# Patient Record
Sex: Female | Born: 1955 | Race: White | Hispanic: No | Marital: Single | Smoking: Never smoker
Health system: Southern US, Community
[De-identification: ages and names within clinical notes are randomized; demographics above are authoritative.]

## PROBLEM LIST (undated history)

## (undated) DIAGNOSIS — I10 Essential (primary) hypertension: Secondary | ICD-10-CM

## (undated) DIAGNOSIS — F32A Depression, unspecified: Secondary | ICD-10-CM

## (undated) DIAGNOSIS — E0590 Thyrotoxicosis, unspecified without thyrotoxic crisis or storm: Secondary | ICD-10-CM

## (undated) DIAGNOSIS — F329 Major depressive disorder, single episode, unspecified: Secondary | ICD-10-CM

## (undated) HISTORY — DX: Major depressive disorder, single episode, unspecified: F32.9

## (undated) HISTORY — DX: Depression, unspecified: F32.A

## (undated) HISTORY — DX: Essential (primary) hypertension: I10

## (undated) HISTORY — DX: Thyrotoxicosis, unspecified without thyrotoxic crisis or storm: E05.90

---

## 2013-12-20 ENCOUNTER — Ambulatory Visit: Payer: Self-pay | Admitting: Neurology

## 2013-12-26 ENCOUNTER — Ambulatory Visit (INDEPENDENT_AMBULATORY_CARE_PROVIDER_SITE_OTHER): Payer: Medicare Other | Admitting: Neurology

## 2013-12-26 ENCOUNTER — Encounter: Payer: Self-pay | Admitting: Neurology

## 2013-12-26 VITALS — BP 132/80 | HR 82 | Resp 16 | Ht 60.0 in | Wt 99.3 lb

## 2013-12-26 DIAGNOSIS — E0500 Thyrotoxicosis with diffuse goiter without thyrotoxic crisis or storm: Secondary | ICD-10-CM

## 2013-12-26 DIAGNOSIS — I10 Essential (primary) hypertension: Secondary | ICD-10-CM

## 2013-12-26 DIAGNOSIS — R413 Other amnesia: Secondary | ICD-10-CM

## 2013-12-26 DIAGNOSIS — F79 Unspecified intellectual disabilities: Secondary | ICD-10-CM

## 2013-12-26 LAB — VITAMIN B12: VITAMIN B 12: 512 pg/mL (ref 211–911)

## 2013-12-26 NOTE — Patient Instructions (Signed)
1. Bloodwork for vitamin B12 2. Schedule head CT without contrast 3. Physical exercises and brain stimulation exercises are important for brain health 4. Follow-up in 6 months

## 2013-12-26 NOTE — Progress Notes (Signed)
NEUROLOGY CONSULTATION NOTE  Laura Keith MRN: [id number] DOB: 21-Oct-1955  Referring provider: Dr. Mila Palmer Primary care provider: Dr. Mila Palmer  Reason for consult:  Evaluate for dementia  Dear Dr Paulino Rily:  Thank you for your kind referral of Luz Mares for consultation of the above symptoms. Although her history is well known to you, please allow me to reiterate it for the purpose of our medical record. The patient was accompanied to the clinic by her twin sister Laura Keith and her adoptive sister Laura Keith, who also provide collateral information. Records and images were personally reviewed where available.  HISTORY OF PRESENT ILLNESS: This is a pleasant 58 year old ambidextrous right-hand dominant woman with a history of hypertension, hyperthyroidism, mild intellectual disability, presenting for evaluation of memory changes.  Her sister Laura Keith provides majority of the history, reporting that Laura Keith has always struggled between what is fact and fantasy.  They had been living with their adoptive mother until this year when they moved into a group home last May.  The staff at the group home and Laura Keith have noticed that her functioning level has been declining.  It has been suggested that she may have dementia, and this has been difficult to distinguish with her intellectual disability. Laura Keith would like to make sure she is doing everything to keep her sisters as healthy as possible.  Laura Keith states that Laura Keith's twin Laura Keith has a lower IQ but she leads the two because [name]'s ability to function and communicate have gone down rapidly.  She has been having problems communicating, with difficulty understanding her speech.  Laura Keith is actually amazed today at how well [name] was speaking in the office, stating she is animated and talkative today. She is usually more withdrawn and shut down. Laura Keith states Laura Keith is "hilarious, but I have not seen her be hilarious in a year."  She has been having diarrhea,  with extensive evaluation leading to a diagnosis of possibly due to anxiety. With this, she would tell group home staff that she did not have diarrhea, but they found the bathroom a mess because she tried to clean it. They are unsure if [name] does not remember she had a BM or if she did not want staff to know.  She had a buttock sore because of this, and has needed help with bathing and hygiene since June.  She sneaks food out, which has been chronic.  She was started on Zoloft around 2 months ago, with note of improvement in her speech. At that time, she was also smearing lipstick all over her face, this has improved as well.  Her sister reports her speech is clearer today than ever, she is surprised Laura Keith is speaking in sentences today.    Labrea denies any headaches, dizziness, diplopia, dysphagia, neck/back pain, focal numbness/tingling/weakness, bowel/bladder dysfunction. She tells me diarrhea has resolved, however Laura Keith states she has heard otherwise.  [name] is active in the group home, they volunteer for the [name] twice a week, go to church choir. They have noticed that she is getting slower when getting in and out of the car. No head injuries or falls.  The twins were adopted, and thought to have fetal alcohol syndrome.  Laboratory Data: 07/24/2013 CBC, CMP normal. TSH 0.15, free T4 1.29, Lipid panel showed cholesterol 221, LDL 144, HbA1c 5.9.  PAST MEDICAL HISTORY: Past Medical History  Diagnosis Date  . Hypertension   . Hyperthyroidism   . Depression     PAST SURGICAL HISTORY: No past surgical  history on file.  MEDICATIONS: No current outpatient prescriptions on file prior to visit.   No current facility-administered medications on file prior to visit.    ALLERGIES: Allergies  Allergen Reactions  . Codeine Nausea Only and Anxiety    FAMILY HISTORY: No family history on file.  SOCIAL HISTORY: History   Social History  . Marital Status: Single    Spouse Name: N/A     Number of Children: N/A  . Years of Education: N/A   Occupational History  . Not on file.   Social History Main Topics  . Smoking status: Never Smoker   . Smokeless tobacco: Not on file  . Alcohol Use: No  . Drug Use: No  . Sexual Activity: Not on file   Other Topics Concern  . Not on file   Social History Narrative  . No narrative on file    REVIEW OF SYSTEMS: Constitutional: No fevers, chills, or sweats, no generalized fatigue, change in appetite Eyes: No visual changes, double vision, eye pain Ear, nose and throat: No hearing loss, ear pain, nasal congestion, sore throat Cardiovascular: No chest pain, palpitations Respiratory:  No shortness of breath at rest or with exertion, wheezes GastrointestinaI: No nausea, vomiting, +diarrhea, no abdominal pain, fecal incontinence Genitourinary:  No dysuria, urinary retention or frequency Musculoskeletal:  No neck pain, back pain Integumentary: No rash, pruritus, skin lesions Neurological: as above Psychiatric: No depression, insomnia, anxiety Endocrine: No palpitations, fatigue, diaphoresis, mood swings, change in appetite, change in weight, increased thirst Hematologic/Lymphatic:  No anemia, purpura, petechiae. Allergic/Immunologic: no itchy/runny eyes, nasal congestion, recent allergic reactions, rashes  PHYSICAL EXAM: Filed Vitals:   12/26/13 1106  BP: 132/80  Pulse: 82  Resp: 16   General: No acute distress Head:  Normocephalic/atraumatic Eyes: Fundoscopic exam shows bilateral sharp discs, no vessel changes, exudates, or hemorrhages Neck: supple, no paraspinal tenderness, full range of motion Back: No paraspinal tenderness Heart: regular rate and rhythm Lungs: Clear to auscultation bilaterally. Vascular: No carotid bruits. Skin/Extremities: No rash, no edema Neurological Exam: Mental status: alert and oriented to person, place, and time, no dysarthria or aphasia, Fund of knowledge is appropriate.  Remote memory  intact.  Attention and concentration are normal.    Able to name objects and repeat phrases.  MMSE - Mini Mental State Exam 12/28/2013  Orientation to time 5  Orientation to Place 4  Registration 3  Attention/ Calculation 5  Recall 0  Language- name 2 objects 2  Language- repeat 1  Language- follow 3 step command 3  Language- read & follow direction 1  Write a sentence 1  Copy design 1  Total score 26   Cranial nerves: CN I: not tested CN II: pupils equal, round and reactive to light, visual fields intact, fundi unremarkable. CN III, IV, VI:  full range of motion, no nystagmus, no ptosis CN V: facial sensation intact CN VII: upper and lower face symmetric CN VIII: hearing intact to finger rub CN IX, X: gag intact, uvula midline CN XI: sternocleidomastoid and trapezius muscles intact CN XII: tongue midline Bulk & Tone: normal, no fasciculations. Motor: 5/5 throughout with no pronator drift. Sensation: intact to light touch, cold, pin, vibration and joint position sense.  No extinction to double simultaneous stimulation.  Romberg test negative Deep Tendon Reflexes: +2 throughout, no ankle clonus Plantar responses: downgoing bilaterally Cerebellar: no incoordination on finger to nose, heel to shin. No dysdiadochokinesia Gait: narrow-based and steady, able to tandem walk adequately. 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  She had a neuropsychological evaluation last 05/2013 which showed a full scale IQ of 69 (Deficient range of intellectual functioning). She was noted to have significant impairments in intellectual, adaptive, and social/emotional functioning, indicative of mild mental retardation.  Her sister is concerned about even  worsening functioning since that time. It was repeatedly noted by her sister Laura Keith that she has not seen Katlin this talkative and animated in a long time. We discussed different etiologies of memory loss, check B12 level. A head CT without contrast will be ordered to assess for any underlying structural abnormality. We discussed that with her baseline cognitive impairment, although it may cause difficulties determining which is baseline impairment versus a progressive neurodegenerative condition at this time, mood (anxiety/depression) may be playing a role with her current changes in functioning, particularly since they have noticed an improvement with SSRI.  Her MMSE today is 26/30. We will continue to monitor her over time, which will help further determine if this is more of a progressive neurodegenerative condition such as dementia, but at this time I discussed with her sister that this cannot be definitively diagnosed. We discussed the importance of control of vascular risk factors, physical, and brain stimulation exercises for brain health.She expressed understanding and will follow-up in 6 months.   Thank you for allowing me to participate in the care of this patient. Please do not hesitate to call for any questions or concerns.   Patrcia Dolly, M.D.  CC: Dr. Paulino Rily

## 2013-12-28 ENCOUNTER — Encounter: Payer: Self-pay | Admitting: Neurology

## 2013-12-28 DIAGNOSIS — I10 Essential (primary) hypertension: Secondary | ICD-10-CM | POA: Insufficient documentation

## 2013-12-28 DIAGNOSIS — R413 Other amnesia: Secondary | ICD-10-CM | POA: Insufficient documentation

## 2013-12-28 DIAGNOSIS — E0500 Thyrotoxicosis with diffuse goiter without thyrotoxic crisis or storm: Secondary | ICD-10-CM | POA: Insufficient documentation

## 2013-12-28 DIAGNOSIS — F79 Unspecified intellectual disabilities: Secondary | ICD-10-CM | POA: Insufficient documentation

## 2013-12-30 ENCOUNTER — Telehealth: Payer: Self-pay | Admitting: Neurology

## 2013-12-30 ENCOUNTER — Ambulatory Visit (HOSPITAL_COMMUNITY): Payer: Medicare Other

## 2013-12-30 NOTE — Telephone Encounter (Signed)
Janelle FloorNaomi, pt's caregiver at the group home called requesting to speak to a nurse regarding pt's appt. C/B [phone]

## 2013-12-30 NOTE — Telephone Encounter (Signed)
Returned call. Pt's CT appt needed to be rescheduled due to some transportation issues with the group home pt is staying at. Laura LauberGave Naomi the # for scheduling so she could call & reschedule pt's appt.

## 2014-01-07 ENCOUNTER — Ambulatory Visit (HOSPITAL_COMMUNITY)
Admission: RE | Admit: 2014-01-07 | Discharge: 2014-01-07 | Disposition: A | Discharge status: Home / Self Care | Payer: Medicare Other | Source: Ambulatory Visit | Attending: Neurology | Admitting: Neurology

## 2014-01-07 DIAGNOSIS — J320 Chronic maxillary sinusitis: Secondary | ICD-10-CM | POA: Insufficient documentation

## 2014-01-07 DIAGNOSIS — J322 Chronic ethmoidal sinusitis: Secondary | ICD-10-CM | POA: Insufficient documentation

## 2014-01-07 DIAGNOSIS — R413 Other amnesia: Secondary | ICD-10-CM | POA: Insufficient documentation

## 2014-01-16 ENCOUNTER — Encounter: Payer: Self-pay | Admitting: Neurology

## 2014-05-14 ENCOUNTER — Ambulatory Visit (INDEPENDENT_AMBULATORY_CARE_PROVIDER_SITE_OTHER): Payer: Medicare Other | Admitting: Podiatry

## 2014-05-14 ENCOUNTER — Encounter: Payer: Self-pay | Admitting: Podiatry

## 2014-05-14 VITALS — BP 124/73 | HR 76 | Resp 11 | Ht 63.0 in | Wt 105.0 lb

## 2014-05-14 DIAGNOSIS — B351 Tinea unguium: Secondary | ICD-10-CM | POA: Diagnosis not present

## 2014-05-14 NOTE — Patient Instructions (Signed)
Return as needed for debridement of toenails Please bring complete medical information in at the next visit

## 2014-05-14 NOTE — Progress Notes (Signed)
Subjective:     Patient ID: Laura BushyNancy Sudbeck, female   DOB: 05-05-1955, 59 y.o.   MRN: [id number]  HPI Comments: N foot exam L 10 toenail  D and O long-term C thickened right 1st toenail A difficult to cut T none   patient denies any history of infection or discomfort in the right hallux toenail. There is no history of any active treatment   Review of Systems  All other systems reviewed and are negative.      Objective:   Physical Exam Patient appears responsive and orientated 3 and presents with representative from a group home  Vascular: DP pulses 2/4 bilaterally PT pulses 2/4 bilaterally Capillary reflex immediate bilaterally  Neurological: Sensation to 10 g monofilament wire intact 5/5 bilaterally Ankle reflex equal reactive bilaterally  Dermatological: The hallux toenails have some texture and color changes with mild hypertrophy. 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

## 2014-05-15 ENCOUNTER — Encounter: Payer: Self-pay | Admitting: Podiatry

## 2014-07-02 ENCOUNTER — Ambulatory Visit: Payer: Medicare Other | Admitting: Neurology

## 2014-07-04 ENCOUNTER — Encounter: Payer: Self-pay | Admitting: Neurology

## 2014-07-21 ENCOUNTER — Other Ambulatory Visit: Payer: Self-pay

## 2014-07-21 DIAGNOSIS — Z1231 Encounter for screening mammogram for malignant neoplasm of breast: Secondary | ICD-10-CM

## 2014-07-30 ENCOUNTER — Ambulatory Visit: Payer: Medicare Other | Admitting: Neurology

## 2014-08-04 ENCOUNTER — Ambulatory Visit
Admission: RE | Admit: 2014-08-04 | Discharge: 2014-08-04 | Disposition: A | Discharge status: Home / Self Care | Payer: Medicare Other | Source: Ambulatory Visit

## 2014-08-04 DIAGNOSIS — Z1231 Encounter for screening mammogram for malignant neoplasm of breast: Secondary | ICD-10-CM

## 2014-08-07 ENCOUNTER — Other Ambulatory Visit: Payer: Self-pay | Admitting: Family Medicine

## 2014-08-07 DIAGNOSIS — R928 Other abnormal and inconclusive findings on diagnostic imaging of breast: Secondary | ICD-10-CM

## 2014-08-18 ENCOUNTER — Ambulatory Visit
Admission: RE | Admit: 2014-08-18 | Discharge: 2014-08-18 | Disposition: A | Discharge status: Home / Self Care | Payer: Medicare Other | Source: Ambulatory Visit | Attending: Family Medicine | Admitting: Family Medicine

## 2014-08-18 ENCOUNTER — Ambulatory Visit: Payer: Medicare Other

## 2014-08-18 ENCOUNTER — Other Ambulatory Visit: Payer: Self-pay | Admitting: Family Medicine

## 2014-08-18 DIAGNOSIS — R928 Other abnormal and inconclusive findings on diagnostic imaging of breast: Secondary | ICD-10-CM

## 2014-09-08 ENCOUNTER — Ambulatory Visit: Payer: Medicare Other | Admitting: Neurology

## 2014-10-10 ENCOUNTER — Ambulatory Visit: Payer: Medicare Other | Admitting: Neurology

## 2014-10-29 ENCOUNTER — Encounter: Payer: Self-pay | Admitting: Neurology

## 2014-10-29 ENCOUNTER — Ambulatory Visit (INDEPENDENT_AMBULATORY_CARE_PROVIDER_SITE_OTHER): Payer: Medicare Other | Admitting: Neurology

## 2014-10-29 VITALS — BP 136/80 | HR 68 | Resp 14 | Ht 60.0 in | Wt 119.0 lb

## 2014-10-29 DIAGNOSIS — F79 Unspecified intellectual disabilities: Secondary | ICD-10-CM

## 2014-10-29 NOTE — Progress Notes (Signed)
NEUROLOGY FOLLOW UP OFFICE NOTE  Laura Keith [id number]  HISTORY OF PRESENT ILLNESS: I had the pleasure of seeing Laura Keith in follow-up in the neurology clinic on 10/29/2014.  The patient was last seen 10 months ago for memory changes. She is again accompanied by her sister who helps supplement the history today. MMSE on her last visit was 26/30. I personally reviewed head CT, which was normal. Her sister reports that she is doing much better cognitively since her last visit. 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 She is very happy to report that she loves her job, and that she is doing "perfect." She denies any headaches, dizziness, diplopia, dysphagia, neck/back pain, focal numbness/tingling/weakness, bowel/bladder dysfunction. No falls.  HPI: This is a pleasant 59 yo ambidextrous right-hand dominant woman with a history of hypertension, hyperthyroidism, mild intellectual disability,who presented in October 2015 for memory changes. Her sister Laura Keith provides majority of the history, reporting that Laura Keith has always struggled between what is fact and fantasy. They had been living with their adoptive mother until 10 until they moved into a group home. The staff at the group home and Laura Keith have noticed that her functioning level has been declining. It has been suggested that she may have dementia, and this has been difficult to distinguish with her intellectual disability. Laura Keith would like to make sure she is doing everything to keep her sisters as healthy as possible.  Laura Keith states that Laura Keith's twin Laura Keith has a lower IQ but she leads the two because Laura Keith's ability to function and communicate have gone down rapidly. She has been having problems communicating, with difficulty understanding her speech. Laura Keith is actually amazed today at how well Laura Keith was speaking in the office, stating  she is animated and talkative today. She is usually more withdrawn and shut down. Laura Keith states Laura Keith is "hilarious, but I have not seen her be hilarious in a year." She has been having diarrhea, with extensive evaluation leading to a diagnosis of possibly due to anxiety. With this, she would tell group home staff that she did not have diarrhea, but they found the bathroom a mess because she tried to clean it. They are unsure if Laura Keith does not remember she had a BM or if she did not want staff to know. She had a buttock sore because of this, and has needed help with bathing and hygiene since June. She sneaks food out, which has been chronic. She was started on Zoloft, with note of improvement in her speech. At that time, she was also smearing lipstick all over her face, this has improved as well. Her sister reports her speech is clearer today than ever, she is surprised Laura Keith is speaking in sentences today.  The twins were adopted, and thought to have fetal alcohol syndrome.  Laboratory Data: 07/24/2013 CBC, CMP normal. TSH 0.15, free T4 1.29, Lipid panel showed cholesterol 221, LDL 144, HbA1c 5.9.  PAST MEDICAL HISTORY: Past Medical History  Diagnosis Date  . Hypertension   . Hyperthyroidism   . Depression     MEDICATIONS: Current Outpatient Prescriptions on File Prior to Visit  Medication Sig Dispense Refill  . ALPRAZolam (XANAX) 0.25 MG tablet Take 0.25 mg by mouth at bedtime as needed. 1 po qd prn    . atenolol (TENORMIN) 100 MG tablet Take 100 mg by mouth daily.     . sertraline (  ZOLOFT) 50 MG tablet Take 50 mg by mouth daily.      No current facility-administered medications on file prior to visit.    ALLERGIES: Allergies  Allergen Reactions  . Codeine Nausea Only and Anxiety    FAMILY HISTORY: Family History  Problem Relation Age of Onset  . Adopted: Yes    SOCIAL HISTORY: History   Social History  . Marital Status: Single    Spouse Name: N/A  . Number of  Children: N/A  . Years of Education: N/A   Occupational History  . Not on file.   Social History Main Topics  . Smoking status: Never Smoker   . Smokeless tobacco: Not on file  . Alcohol Use: No  . Drug Use: No  . Sexual Activity: No   Other Topics Concern  . Not on file   Social History Narrative    REVIEW OF SYSTEMS: Constitutional: No fevers, chills, or sweats, no generalized fatigue, change in appetite Eyes: No visual changes, double vision, eye pain Ear, nose and throat: No hearing loss, ear pain, nasal congestion, sore throat Cardiovascular: No chest pain, palpitations Respiratory:  No shortness of breath at rest or with exertion, wheezes GastrointestinaI: No nausea, vomiting, diarrhea, abdominal pain, fecal incontinence Genitourinary:  No dysuria, urinary retention or frequency Musculoskeletal:  No neck pain, back pain Integumentary: No rash, pruritus, skin lesions Neurological: as above Psychiatric: No depression, insomnia, anxiety Endocrine: No palpitations, fatigue, diaphoresis, mood swings, change in appetite, change in weight, increased thirst Hematologic/Lymphatic:  No anemia, purpura, petechiae. Allergic/Immunologic: no itchy/runny eyes, nasal congestion, recent allergic reactions, rashes  PHYSICAL EXAM: Filed Vitals:   10/29/14 1458  BP: 136/80  Pulse: 68  Resp: 14   General: No acute distress Head:  Normocephalic/atraumatic Neck: supple, no paraspinal tenderness, full range of motion Heart:  Regular rate and rhythm Lungs:  Clear to auscultation bilaterally Back: No paraspinal tenderness Skin/Extremities: No rash, no edema Neurological Exam: alert and oriented to person, place, and time. No aphasia or dysarthria. Fund of knowledge is appropriate.  Remote memory intact.  Attention and concentration are normal.    Able to name objects and repeat phrases. Clock drawing 4/5 MMSE - Mini Mental State Exam 10/29/2014 12/28/2013  Orientation to time 4 5    Orientation to Place 5 4  Registration 3 3  Attention/ Calculation 5 5  Recall 1 0  Language- name 2 objects 2 2  Language- repeat 1 1  Language- follow 3 step command 3 3  Language- read & follow direction 1 1  Write a sentence 1 1  Copy design 1 1  Total score 27 26   Cranial nerves: Pupils equal, round, reactive to light.  Fundoscopic exam unremarkable, no papilledema. Extraocular movements intact with no nystagmus. Visual fields full. Facial sensation intact. No facial asymmetry. Tongue, uvula, palate midline.  Motor: Bulk and tone normal, muscle strength 5/5 throughout with no pronator drift.  Sensation to light touch intact.  No extinction to double simultaneous stimulation.  Deep tendon reflexes 1+ throughout, toes downgoing.  Finger to nose testing intact.  Gait narrow-based and steady, able to tandem walk adequately.  Romberg negative.  IMPRESSION: This is a 59 yo RH woman with a history of fetal alcohol syndrome by history (patient adopted), hypertension, hyperthyroidism, mild intellectual disability, who presented for worsening cognitive functioning. She had a neuropsychological evaluation last 05/2013 which showed a full scale IQ of 69 (Deficient range of intellectual functioning). She was noted to have significant impairments in  intellectual, adaptive, and social/emotional functioning, indicative of mild mental retardation.Since her last visit, she has been doing better cognitively, with starting Zoloft and new environment where she gets more stimulation. MMSE today 27/30 (26/30 in October 2015). She continues to do well, no evidence of dementia at this time. Findings discussed with the patient and her sister. We again discussed the importance of control of vascular risk factors, physical, and brain stimulation exercises for brain health.She has moved to [facility] and will follow-up with Neurology on a prn basis. They know to call for any changes.  Thank you for allowing me to  participate in her care.  Please do not hesitate to call for any questions or concerns.  The duration of this appointment visit was 14 minutes of face-to-face time with the patient.  Greater than 50% of this time was spent in counseling, explanation of diagnosis, planning of further management, and coordination of care.   Patrcia Dolly, M.D.   CC: Dr. Paulino Rily

## 2014-10-29 NOTE — Patient Instructions (Signed)
1. Follow up on as needed basis

## 2015-01-14 ENCOUNTER — Other Ambulatory Visit: Payer: Self-pay | Admitting: Family Medicine

## 2015-01-14 DIAGNOSIS — R928 Other abnormal and inconclusive findings on diagnostic imaging of breast: Secondary | ICD-10-CM

## 2015-02-04 ENCOUNTER — Ambulatory Visit
Admission: RE | Admit: 2015-02-04 | Discharge: 2015-02-04 | Disposition: A | Discharge status: Home / Self Care | Payer: Medicare Other | Source: Ambulatory Visit | Attending: Family Medicine | Admitting: Family Medicine

## 2015-02-04 ENCOUNTER — Other Ambulatory Visit: Payer: Self-pay | Admitting: Family Medicine

## 2015-02-04 DIAGNOSIS — R928 Other abnormal and inconclusive findings on diagnostic imaging of breast: Secondary | ICD-10-CM

## 2015-08-28 IMAGING — CT CT HEAD W/O CM
1 of 2 series · 16 of 30 positions shown, 20 images · non-contrast
Comparison: None.

CLINICAL DATA: Memory loss, confusion for 6 months

EXAM:
CT HEAD WITHOUT CONTRAST
TECHNIQUE: Contiguous axial images were obtained from the base of the skull
through the vertex without intravenous contrast.

[Series 3: head 2.0 h70h · axial · 0.42mm/px · z∈[-136,-2]mm · 16 of 75 slices shown, 20 images]
[im 4/75  brain]
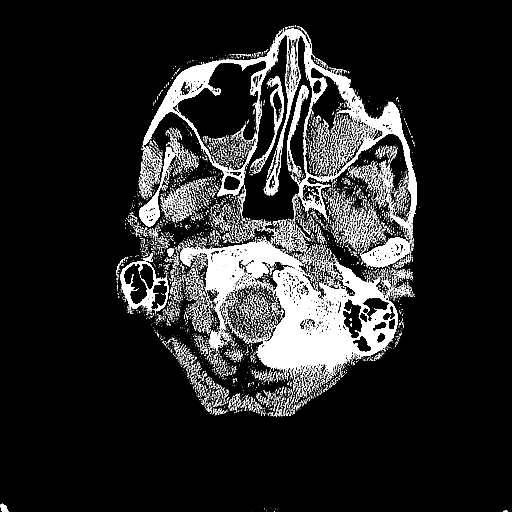
[im 4/75  bone]
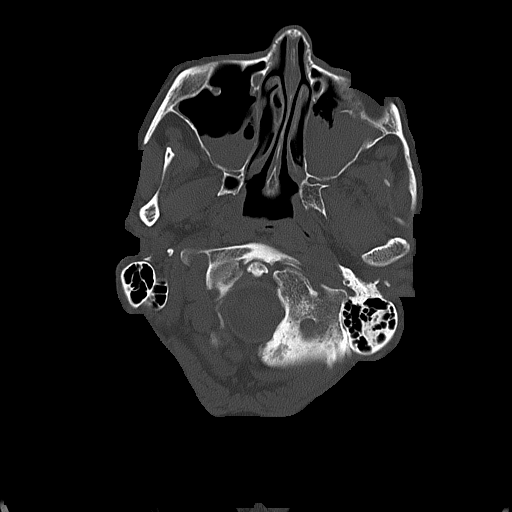
[im 8/75  brain]
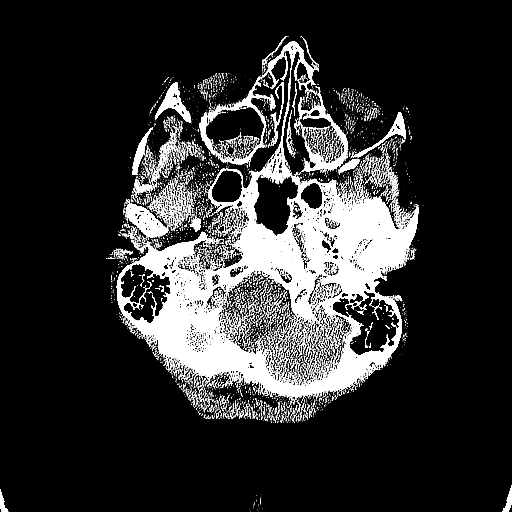
[im 12/75  brain]
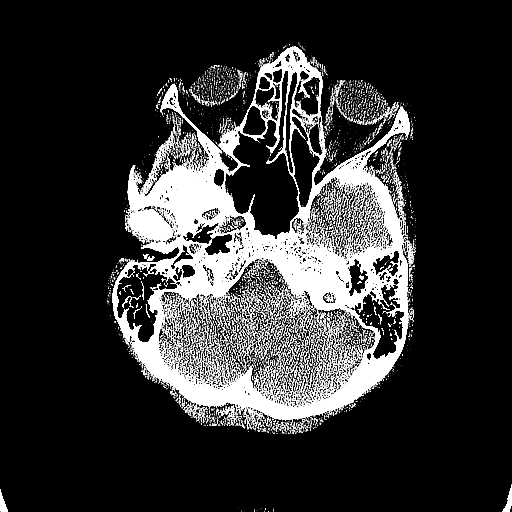
[im 19/75  brain]
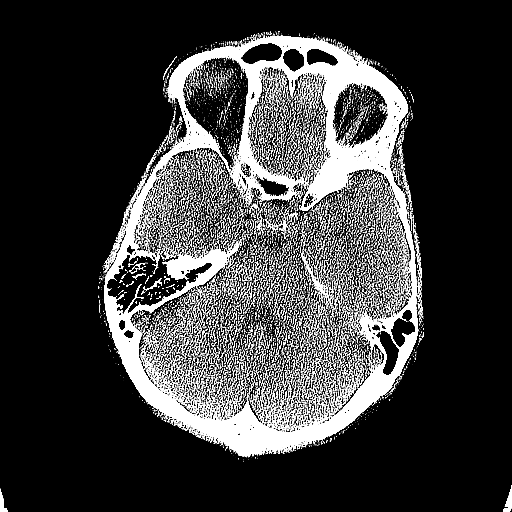
[im 23/75  brain]
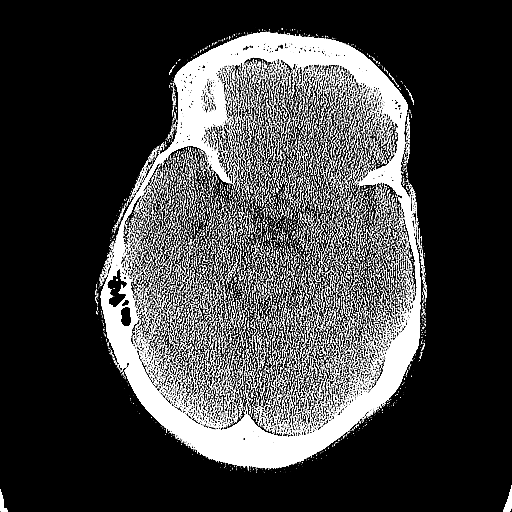
[im 23/75  bone]
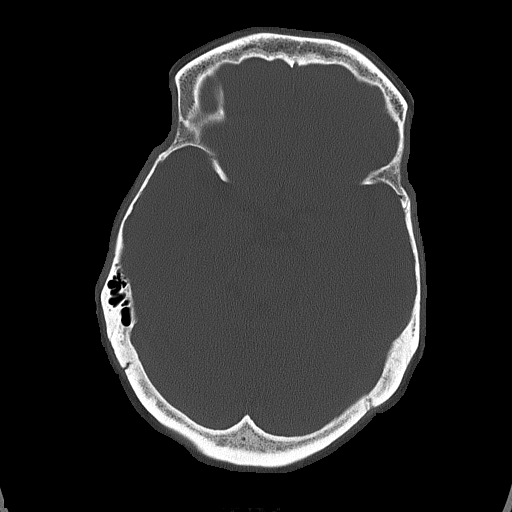
[im 26/75  brain]
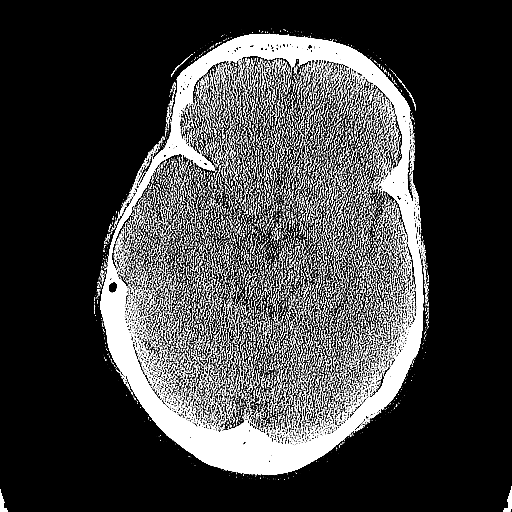
[im 30/75  brain]
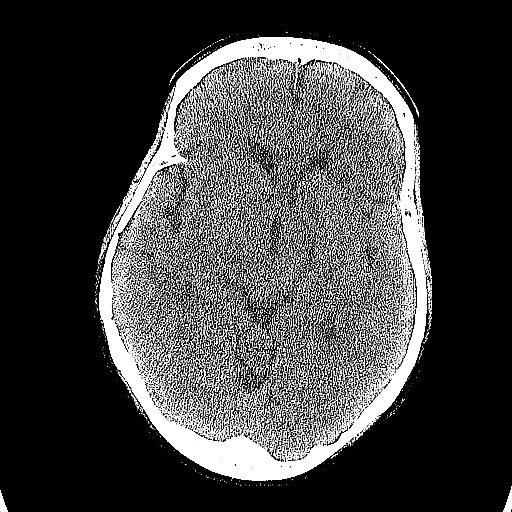
[im 34/75  brain]
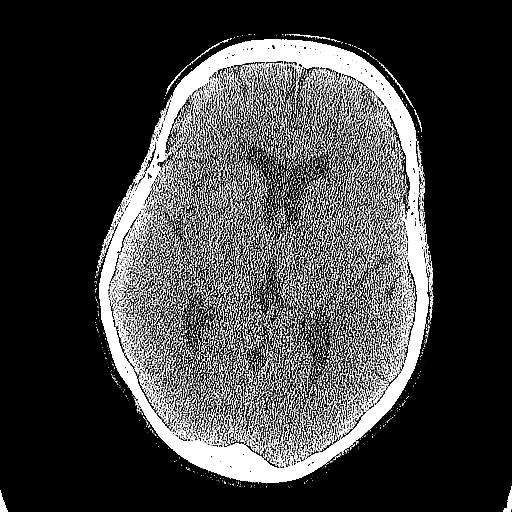
[im 41/75  brain]
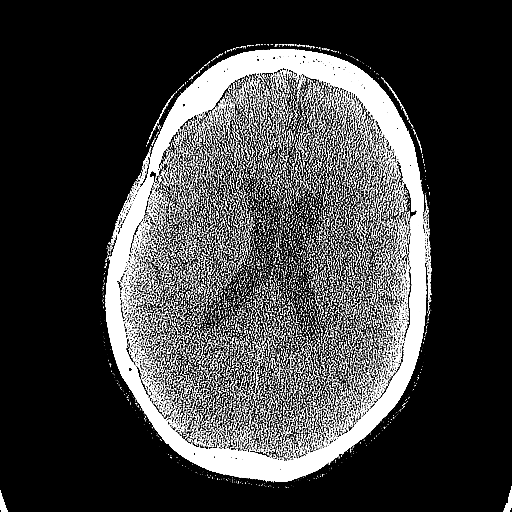
[im 41/75  bone]
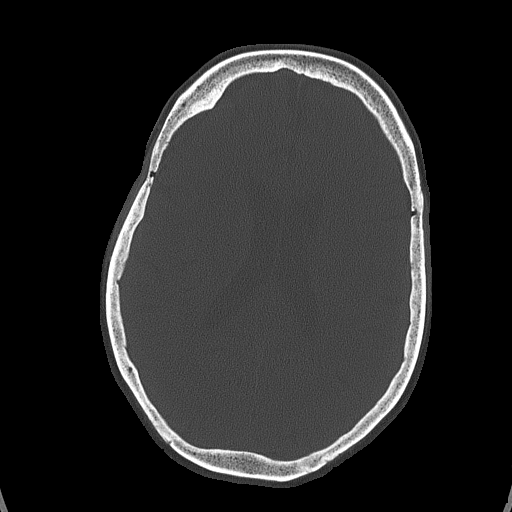
[im 45/75  brain]
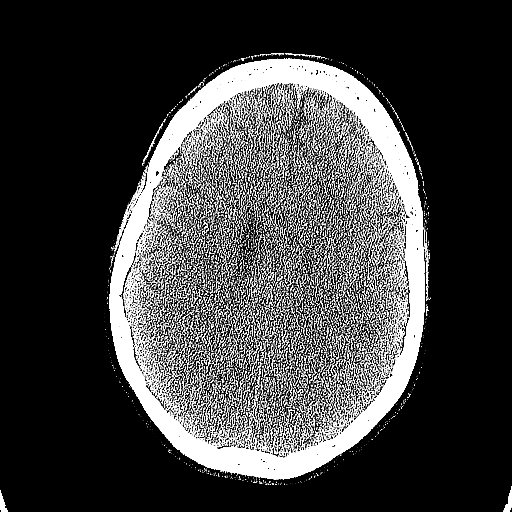
[im 49/75  brain]
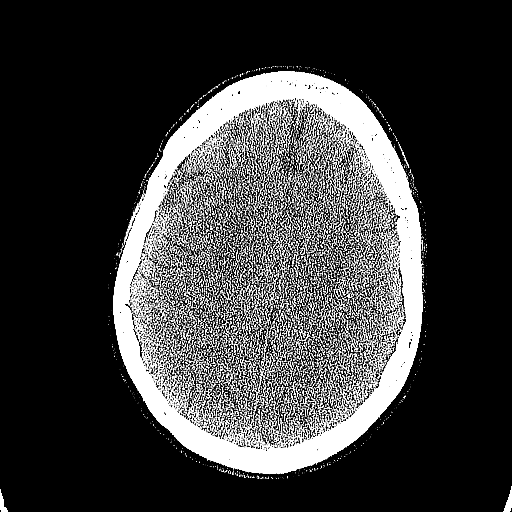
[im 52/75  brain]
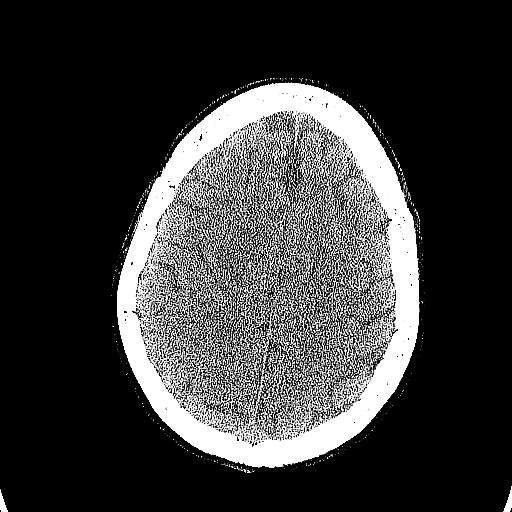
[im 56/75  brain]
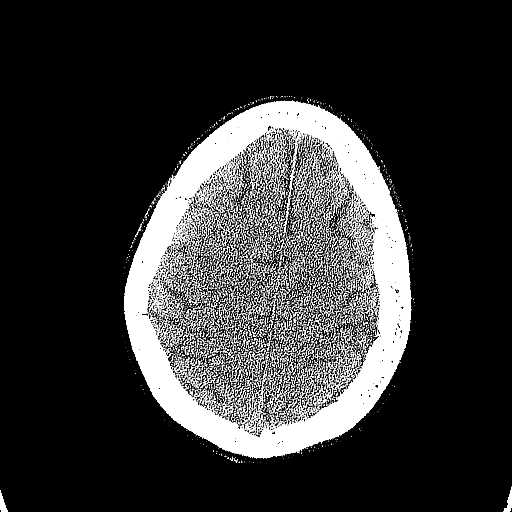
[im 56/75  bone]
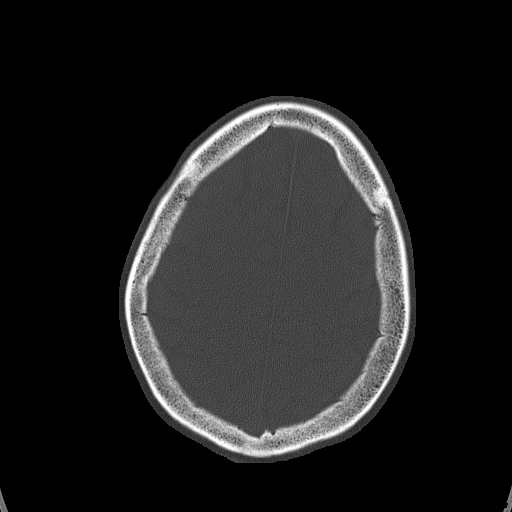
[im 63/75  brain]
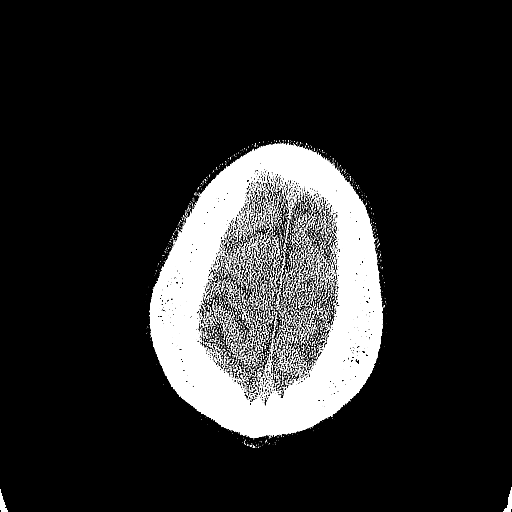
[im 67/75  brain]
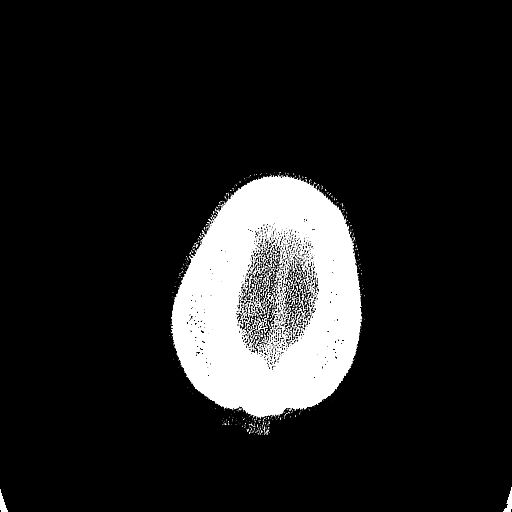
[im 71/75  brain]
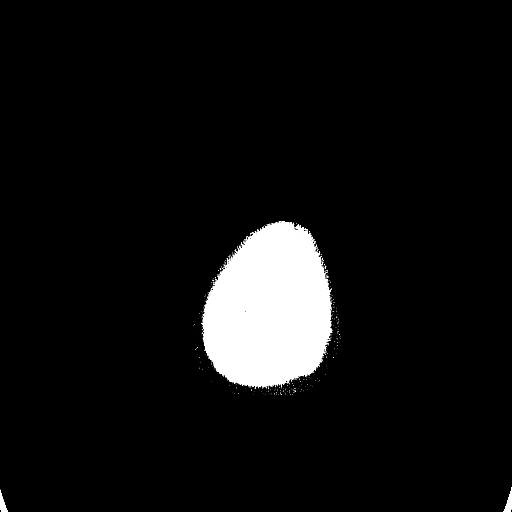

[16 of 30 positions shown; findings below may reference images not displayed]

FINDINGS: The ventricular system is normal in size and configuration, and the
septum is in a normal midline position. The fourth ventricle and
basilar cisterns are unremarkable. No hemorrhage, mass lesion, or
acute infarction is seen.

On bone window images there is considerable opacification of the
maxillary sinuses left-greater-than-right most consistent with
maxillary sinusitis. There is also some opacification of ethmoid air
cells. The sphenoid sinus and frontal sinus are unremarkable. No
acute bony abnormality is seen.
IMPRESSION: 1. Negative unenhanced CT of the brain.
2. Maxillary and ethmoid sinusitis.
